# Patient Record
Sex: Male | Born: 1990 | Race: Black or African American | Hispanic: No | Marital: Single | Smoking: Current some day smoker
Health system: Southern US, Community
[De-identification: ages and names within clinical notes are randomized; demographics above are authoritative.]

## PROBLEM LIST (undated history)

## (undated) DIAGNOSIS — J45909 Unspecified asthma, uncomplicated: Secondary | ICD-10-CM

---
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
## 2010-02-03 ENCOUNTER — Emergency Department (HOSPITAL_COMMUNITY): Admission: EM | Admit: 2010-02-03 | Discharge: 2010-02-03 | Payer: Self-pay | Admitting: Emergency Medicine
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
## 2010-07-02 LAB — GC/CHLAMYDIA PROBE AMP, GENITAL
Chlamydia, DNA Probe: POSITIVE — AB
GC Probe Amp, Genital: POSITIVE — AB

## 2011-01-14 ENCOUNTER — Inpatient Hospital Stay (INDEPENDENT_AMBULATORY_CARE_PROVIDER_SITE_OTHER)
Admission: RE | Admit: 2011-01-14 | Discharge: 2011-01-14 | Disposition: A | Discharge status: Home / Self Care | Payer: Self-pay | Source: Ambulatory Visit | Attending: Family Medicine | Admitting: Family Medicine

## 2011-01-14 DIAGNOSIS — N342 Other urethritis: Secondary | ICD-10-CM

## 2011-01-15 LAB — GC/CHLAMYDIA PROBE AMP, GENITAL
Chlamydia, DNA Probe: NEGATIVE
GC Probe Amp, Genital: NEGATIVE

## 2011-05-19 ENCOUNTER — Emergency Department (HOSPITAL_COMMUNITY)
Admission: EM | Admit: 2011-05-19 | Discharge: 2011-05-19 | Disposition: A | Discharge status: Home / Self Care | Payer: Self-pay | Attending: Emergency Medicine | Admitting: Emergency Medicine

## 2011-05-19 ENCOUNTER — Encounter (HOSPITAL_COMMUNITY): Payer: Self-pay | Admitting: *Deleted

## 2011-05-19 DIAGNOSIS — L2989 Other pruritus: Secondary | ICD-10-CM | POA: Insufficient documentation

## 2011-05-19 DIAGNOSIS — L298 Other pruritus: Secondary | ICD-10-CM | POA: Insufficient documentation

## 2011-05-19 DIAGNOSIS — R21 Rash and other nonspecific skin eruption: Secondary | ICD-10-CM | POA: Insufficient documentation

## 2011-05-19 DIAGNOSIS — B86 Scabies: Secondary | ICD-10-CM | POA: Insufficient documentation

## 2011-05-19 MED ORDER — PERMETHRIN 5 % EX CREA: TOPICAL_CREAM | CUTANEOUS | Status: AC

## 2011-05-19 NOTE — ED Notes (Signed)
The pt says he has scabies.  His girlfriend was just diagnosed .  He has had the rash for 30 days

## 2011-05-19 NOTE — ED Provider Notes (Signed)
History     CSN: [id number]  Arrival date & time 05/19/11  1950   None     Chief Complaint  Patient presents with  . Rash    HPI: Patient is a 21 y.o. male presenting with rash. The history is provided by the patient.  Rash  This is a new problem. The current episode started more than 1 week ago. The problem has been gradually worsening. There has been no fever. The rash is present on the torso, right arm, left arm, right upper leg and left upper leg.  Patient reports recent exposure to friend w/ scabies. States has had "itchy" rash x 2  weeks.   History reviewed. No pertinent past medical history.  History reviewed. No pertinent past surgical history.  History reviewed. No pertinent family history.  History  Substance Use Topics  . Smoking status: Not on file  . Smokeless tobacco: Not on file  . Alcohol Use: Not on file      Review of Systems  Constitutional: Negative.   HENT: Negative.   Eyes: Negative.   Respiratory: Negative.   Cardiovascular: Negative.   Gastrointestinal: Negative.   Genitourinary: Negative.   Musculoskeletal: Negative.   Skin: Positive for rash.  Neurological: Negative.   Hematological: Negative.   Psychiatric/Behavioral: Negative.     Allergies  Review of patient's allergies indicates no known allergies.  Home Medications  No current outpatient prescriptions on file.  There were no vitals taken for this visit.  Physical Exam  Constitutional: He appears well-developed and well-nourished.  HENT:  Head: Normocephalic and atraumatic.  Eyes: Conjunctivae are normal.  Cardiovascular: Normal rate.   Pulmonary/Chest: Effort normal.  Musculoskeletal: Normal range of motion.  Neurological: He is alert.  Skin: Skin is warm and dry.       Mildly erythematous, scattered papules to BUE's and bil upper legs c/w scabies. No burrows noted to hands or wrist.  Psychiatric: He has a normal mood and affect.    ED Course  Procedures   Labs  Reviewed - No data to display No results found.   No diagnosis found.    MDM  HPI/PE and clinical findings c/w scabies        Leanne Chang, NP 05/19/11 2117

## 2011-05-20 NOTE — ED Provider Notes (Signed)
Medical screening examination/treatment/procedure(s) were performed by non-physician practitioner and as supervising physician I was immediately available for consultation/collaboration.   Benny Lennert, MD 05/20/11 2306

## 2012-10-10 ENCOUNTER — Emergency Department (HOSPITAL_COMMUNITY)
Admission: EM | Admit: 2012-10-10 | Discharge: 2012-10-10 | Disposition: A | Discharge status: Home / Self Care | Payer: Self-pay | Attending: Emergency Medicine | Admitting: Emergency Medicine

## 2012-10-10 ENCOUNTER — Encounter (HOSPITAL_COMMUNITY): Payer: Self-pay | Admitting: Nurse Practitioner

## 2012-10-10 DIAGNOSIS — F17200 Nicotine dependence, unspecified, uncomplicated: Secondary | ICD-10-CM | POA: Insufficient documentation

## 2012-10-10 DIAGNOSIS — B009 Herpesviral infection, unspecified: Secondary | ICD-10-CM | POA: Insufficient documentation

## 2012-10-10 DIAGNOSIS — K1370 Unspecified lesions of oral mucosa: Secondary | ICD-10-CM | POA: Insufficient documentation

## 2012-10-10 LAB — RAPID STREP SCREEN (MED CTR MEBANE ONLY): Streptococcus, Group A Screen (Direct): NEGATIVE

## 2012-10-10 MED ORDER — HYDROCODONE-ACETAMINOPHEN 5-325 MG PO TABS
2.0000 | ORAL_TABLET | Freq: Once | ORAL | Status: AC
  Administered 2012-10-10: 2 via ORAL
  Filled 2012-10-10: qty 2

## 2012-10-10 MED ORDER — ACYCLOVIR 200 MG PO CAPS
200.0000 mg | ORAL_CAPSULE | Freq: Once | ORAL | Status: AC
  Administered 2012-10-10: 200 mg via ORAL
  Filled 2012-10-10: qty 1

## 2012-10-10 MED ORDER — MAGIC MOUTHWASH W/LIDOCAINE: 5.0000 mL | Freq: Four times a day (QID) | ORAL | Status: DC | PRN

## 2012-10-10 MED ORDER — ACYCLOVIR 400 MG PO TABS: 800.0000 mg | ORAL_TABLET | Freq: Every day | ORAL | Status: DC

## 2012-10-10 NOTE — ED Notes (Signed)
States he woke this am and had sores around mouth, sore throat, painful to swallow. Breathing easily

## 2012-10-10 NOTE — ED Notes (Signed)
Pt states he started 2 days ago with sorethroat, sores on mouth and lip swelling. Tonsils enlarged.

## 2012-10-10 NOTE — ED Notes (Signed)
Waiting on medication from the pharmacy before discharging pt. 

## 2012-10-10 NOTE — ED Provider Notes (Signed)
   History    CSN: [id number] Arrival date & time 10/10/12  1302  First MD Initiated Contact with Patient 10/10/12 1510     Chief Complaint  Patient presents with  . Sore Throat   (Consider location/radiation/quality/duration/timing/severity/associated sxs/prior Treatment) HPI Comments: Patient is a 22 year old male with no past medical history who presents with a 2 day history of sores in his mouth. Patient reports waking up with the sores 2 days ago. He reports multiple areas of lesions in his mouth that are painful. The pain is burning and severe. Patient has not tried anything for symptoms. No alleviating factors. Touching the affected areas makes the pain worse. No other symptoms.   Patient is a 22 y.o. male presenting with pharyngitis.  Sore Throat   History reviewed. No pertinent past medical history. History reviewed. No pertinent past surgical history. History reviewed. No pertinent family history. History  Substance Use Topics  . Smoking status: Current Some Day Smoker    Types: Cigarettes  . Smokeless tobacco: Not on file  . Alcohol Use: Yes    Review of Systems  Skin: Positive for wound.  All other systems reviewed and are negative.    Allergies  Review of patient's allergies indicates no known allergies.  Home Medications  No current outpatient prescriptions on file. BP 134/93  Pulse 99  Temp(Src) 98.8 F (37.1 C) (Oral)  Resp 20  SpO2 94% Physical Exam  Nursing note and vitals reviewed. Constitutional: He is oriented to person, place, and time. He appears well-developed and well-nourished. No distress.  HENT:  Head: Normocephalic and atraumatic.  Mouth/Throat: No oropharyngeal exudate.  Multiple areas of small, clustered ulcerative lesions on erythematous base in mouth. Tender to palpate.   Eyes: Conjunctivae and EOM are normal.  Neck: Normal range of motion.  Cardiovascular: Normal rate and regular rhythm.  Exam reveals no gallop and no friction  rub.   No murmur heard. Pulmonary/Chest: Effort normal and breath sounds normal. He has no wheezes. He has no rales. He exhibits no tenderness.  Abdominal: Soft. There is no tenderness.  Musculoskeletal: Normal range of motion.  Neurological: He is alert and oriented to person, place, and time. Coordination normal.  Speech is goal-oriented. Moves limbs without ataxia.   Skin: Skin is warm and dry.  Psychiatric: He has a normal mood and affect. His behavior is normal.    ED Course  Procedures (including critical care time) Labs Reviewed  RAPID STREP SCREEN  CULTURE, GROUP A STREP   No results found. 1. Herpes simplex     MDM  4:03 PM Patient appears to have herpes simplex. Patient will have acyclovir and Vicodin for pain. Patient will be discharged with Acyclovir and magic mouth wash prescription. Patient will have resource guide for follow up.   Emilia Beck, PA-C 10/10/12 1612

## 2012-10-10 NOTE — ED Provider Notes (Addendum)
Medical screening examination/treatment/procedure(s) were conducted as a shared visit with non-physician practitioner(s) and myself.  I personally evaluated the patient during the encounter  Multiple ulcerations along both lips, inner oropharynx, and tip of tongue, painful, swollen.  Occurred aver past 2 days.  No prior h/o ulcers.  Pt has had some myalgias, no overt fevers, has sore throat, mild pain to swallow.  Pt denies IV drug use, recent new sexual partners.  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  Would benefit from symptoms control and acyclovir or valtrex.  Pt is not toxic, no fever, handling secretions, no SOB, normal lung sounds.  Gavin Pound. Oletta Lamas, MD 10/10/12 1559  Gavin Pound. Tonetta Napoles, MD 10/10/12 1610

## 2012-10-12 LAB — CULTURE, GROUP A STREP

## 2012-11-20 ENCOUNTER — Encounter (HOSPITAL_COMMUNITY): Payer: Self-pay | Admitting: *Deleted

## 2012-11-20 ENCOUNTER — Emergency Department (HOSPITAL_COMMUNITY)
Admission: EM | Admit: 2012-11-20 | Discharge: 2012-11-20 | Disposition: A | Discharge status: Home / Self Care | Payer: Self-pay | Attending: Emergency Medicine | Admitting: Emergency Medicine

## 2012-11-20 DIAGNOSIS — Z79899 Other long term (current) drug therapy: Secondary | ICD-10-CM | POA: Insufficient documentation

## 2012-11-20 DIAGNOSIS — K130 Diseases of lips: Secondary | ICD-10-CM | POA: Insufficient documentation

## 2012-11-20 DIAGNOSIS — F17200 Nicotine dependence, unspecified, uncomplicated: Secondary | ICD-10-CM | POA: Insufficient documentation

## 2012-11-20 MED ORDER — HYDROCODONE-ACETAMINOPHEN 5-325 MG PO TABS: 1.0000 | ORAL_TABLET | ORAL | Status: DC | PRN

## 2012-11-20 MED ORDER — ONDANSETRON 4 MG PO TBDP
8.0000 mg | ORAL_TABLET | Freq: Once | ORAL | Status: AC
Start: 2012-11-20 — End: 2012-11-20
  Administered 2012-11-20: 8 mg via ORAL
  Filled 2012-11-20: qty 2

## 2012-11-20 MED ORDER — HYDROMORPHONE HCL PF 1 MG/ML IJ SOLN
1.0000 mg | Freq: Once | INTRAMUSCULAR | Status: AC
  Administered 2012-11-20: 1 mg via INTRAMUSCULAR
  Filled 2012-11-20: qty 1

## 2012-11-20 MED ORDER — HYDROCODONE-ACETAMINOPHEN 5-325 MG PO TABS
1.0000 | ORAL_TABLET | Freq: Once | ORAL | Status: AC
  Administered 2012-11-20: 1 via ORAL
  Filled 2012-11-20: qty 1

## 2012-11-20 MED ORDER — DOXYCYCLINE HYCLATE 100 MG PO CAPS: 100.0000 mg | ORAL_CAPSULE | Freq: Two times a day (BID) | ORAL | Status: DC

## 2012-11-20 NOTE — ED Notes (Signed)
Pt reports having swelling to upper lip since tues, severe swelling noted and spreading into his left cheek. Airway intact.

## 2012-11-20 NOTE — ED Provider Notes (Signed)
CSN: [id number]     Arrival date & time 11/20/12  1457 History     First MD Initiated Contact with Patient 11/20/12 1502     Chief Complaint  Patient presents with  . Oral Swelling   (Consider location/radiation/quality/duration/timing/severity/associated sxs/prior Treatment) HPI Comments: Patient reports he developed a small area of swelling over his left upper lip 6 days ago, the next day the area increased in size.  It has since steadily increased in size, has become painful, and is draining pus.  Denies fevers, chills, body aches, N/V.  Denies other lesions.  No known medication problems.  Recently d/w herpes simplex with multiple lesions of the mouth/lips 10/10/12.   The history is provided by the patient.    No past medical history on file. No past surgical history on file. No family history on file. History  Substance Use Topics  . Smoking status: Current Some Day Smoker    Types: Cigarettes  . Smokeless tobacco: Not on file  . Alcohol Use: Yes    Review of Systems  Constitutional: Negative for fever and chills.  HENT: Positive for mouth sores.   Gastrointestinal: Negative for nausea and vomiting.  Musculoskeletal: Negative for myalgias.  Allergic/Immunologic: Negative for immunocompromised state.    Allergies  Review of patient's allergies indicates no known allergies.  Home Medications   Current Outpatient Rx  Name  Route  Sig  Dispense  Refill  . albuterol (PROVENTIL HFA;VENTOLIN HFA) 108 (90 BASE) MCG/ACT inhaler   Inhalation   Inhale 2 puffs into the lungs every 6 (six) hours as needed for wheezing.         . Multiple Vitamin (MULTIVITAMIN WITH MINERALS) TABS   Oral   Take 1 tablet by mouth daily.          BP 145/87  Pulse 97  Resp 18  Ht 5\' 6"  (1.676 m)  Wt 155 lb (70.308 kg)  BMI 25.03 kg/m2  SpO2 100% Physical Exam  Nursing note and vitals reviewed. Constitutional: He appears well-developed and well-nourished. No distress.  HENT:  Head:  Normocephalic and atraumatic.    Mouth/Throat: Oropharynx is clear and moist. No oropharyngeal exudate.  Neck: Normal range of motion. Neck supple.  Pulmonary/Chest: Effort normal.  Neurological: He is alert.  Skin: He is not diaphoretic.    ED Course   Procedures (including critical care time)  Labs Reviewed - No data to display No results found.  INCISION AND DRAINAGE Performed by: Trixie Dredge B Consent: Verbal consent obtained. Risks and benefits: risks, benefits and alternatives were discussed Type: abscess  Body area: left upper lip  Anesthesia: infraorbital block Incision was made with a scalpel.  Local anesthetic: lidocaine 2% no epinephrine  Anesthetic total: 5 ml  Complexity: complex Blunt dissection to break up loculations  Drainage: purulent  Drainage amount: large  Patient tolerance: Patient tolerated the procedure well with no immediate complications.     1. Lip abscess     MDM  Pt with recent outbreak of herpes simplex over his mouth p/w apparent abscess of left upper lip.  He is well appearing.  No known medical problems.  Not known to be immunocompromised.  I discussed this with him and encouraged him to get testing for HIV and diabetes - pt declined for this visit but states he will follow up.  I&D performed in ED with large amount of purulent discharge.  Pt d/c home on doxycycline, pain medication.  Discussed all results,  findings, treatment, follow up  with patient.  Pt given verbal and written return precautions.  Pt verbalizes understanding and agrees with plan.     Trixie Dredge, PA-C 11/20/12 1639

## 2012-11-22 NOTE — ED Provider Notes (Signed)
Medical screening examination/treatment/procedure(s) were performed by non-physician practitioner and as supervising physician I was immediately available for consultation/collaboration.   Christopher J. Pollina, MD 11/22/12 0719 

## 2013-06-18 DEATH — deceased

## 2014-01-22 ENCOUNTER — Encounter (HOSPITAL_COMMUNITY): Payer: Self-pay | Admitting: Emergency Medicine

## 2014-01-22 ENCOUNTER — Emergency Department (HOSPITAL_COMMUNITY)
Admission: EM | Admit: 2014-01-22 | Discharge: 2014-01-22 | Disposition: A | Discharge status: Home / Self Care | Payer: Self-pay | Attending: Emergency Medicine | Admitting: Emergency Medicine

## 2014-01-22 DIAGNOSIS — G44309 Post-traumatic headache, unspecified, not intractable: Secondary | ICD-10-CM | POA: Insufficient documentation

## 2014-01-22 DIAGNOSIS — Y92410 Unspecified street and highway as the place of occurrence of the external cause: Secondary | ICD-10-CM | POA: Insufficient documentation

## 2014-01-22 DIAGNOSIS — Z720 Tobacco use: Secondary | ICD-10-CM | POA: Insufficient documentation

## 2014-01-22 DIAGNOSIS — G44319 Acute post-traumatic headache, not intractable: Secondary | ICD-10-CM

## 2014-01-22 DIAGNOSIS — Y9389 Activity, other specified: Secondary | ICD-10-CM | POA: Insufficient documentation

## 2014-01-22 MED ORDER — CYCLOBENZAPRINE HCL 10 MG PO TABS: 10.0000 mg | ORAL_TABLET | Freq: Two times a day (BID) | ORAL | Status: DC | PRN

## 2014-01-22 MED ORDER — NAPROXEN 500 MG PO TABS: 500.0000 mg | ORAL_TABLET | Freq: Two times a day (BID) | ORAL | Status: DC

## 2014-01-22 MED ORDER — NAPROXEN 250 MG PO TABS
500.0000 mg | ORAL_TABLET | ORAL | Status: AC
  Administered 2014-01-22: 500 mg via ORAL
  Filled 2014-01-22: qty 2

## 2014-01-22 NOTE — Discharge Instructions (Signed)
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
## 2017-05-18 ENCOUNTER — Emergency Department (HOSPITAL_COMMUNITY)
Admission: EM | Admit: 2017-05-18 | Discharge: 2017-05-19 | Disposition: A | Discharge status: Home / Self Care | Payer: No Typology Code available for payment source | Attending: Emergency Medicine | Admitting: Emergency Medicine

## 2017-05-18 ENCOUNTER — Encounter (HOSPITAL_COMMUNITY): Payer: Self-pay | Admitting: Emergency Medicine

## 2017-05-18 ENCOUNTER — Other Ambulatory Visit: Payer: Self-pay

## 2017-05-18 DIAGNOSIS — M791 Myalgia, unspecified site: Secondary | ICD-10-CM | POA: Diagnosis present

## 2017-05-18 DIAGNOSIS — M7918 Myalgia, other site: Secondary | ICD-10-CM | POA: Diagnosis not present

## 2017-05-18 HISTORY — DX: Unspecified asthma, uncomplicated: J45.909
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
## 2017-05-19 MED ORDER — CYCLOBENZAPRINE HCL 10 MG PO TABS: 10.0000 mg | ORAL_TABLET | Freq: Two times a day (BID) | ORAL | 0 refills | Status: DC | PRN

## 2017-05-19 MED ORDER — IBUPROFEN 600 MG PO TABS: 600.0000 mg | ORAL_TABLET | Freq: Four times a day (QID) | ORAL | 0 refills | Status: DC | PRN
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
## 2019-07-28 ENCOUNTER — Other Ambulatory Visit: Payer: Self-pay

## 2019-07-28 ENCOUNTER — Encounter (HOSPITAL_COMMUNITY): Payer: Self-pay

## 2019-07-28 ENCOUNTER — Ambulatory Visit (HOSPITAL_COMMUNITY)
Admission: EM | Admit: 2019-07-28 | Discharge: 2019-07-28 | Disposition: A | Discharge status: Home / Self Care | Payer: Self-pay | Attending: Urgent Care | Admitting: Urgent Care

## 2019-07-28 DIAGNOSIS — L309 Dermatitis, unspecified: Secondary | ICD-10-CM | POA: Insufficient documentation

## 2019-07-28 DIAGNOSIS — Z7251 High risk heterosexual behavior: Secondary | ICD-10-CM | POA: Insufficient documentation

## 2019-07-28 DIAGNOSIS — R369 Urethral discharge, unspecified: Secondary | ICD-10-CM | POA: Insufficient documentation

## 2019-07-28 MED ORDER — AZITHROMYCIN 250 MG PO TABS
1000.0000 mg | ORAL_TABLET | Freq: Once | ORAL | Status: AC
  Administered 2019-07-28: 1000 mg via ORAL

## 2019-07-28 MED ORDER — CEFTRIAXONE SODIUM 500 MG IJ SOLR
INTRAMUSCULAR | Status: AC
  Filled 2019-07-28: qty 500

## 2019-07-28 MED ORDER — LIDOCAINE HCL (PF) 1 % IJ SOLN
INTRAMUSCULAR | Status: AC
  Filled 2019-07-28: qty 2

## 2019-07-28 MED ORDER — CEFTRIAXONE SODIUM 500 MG IJ SOLR
500.0000 mg | Freq: Once | INTRAMUSCULAR | Status: AC
  Administered 2019-07-28: 18:00:00 500 mg via INTRAMUSCULAR

## 2019-07-28 MED ORDER — TRIAMCINOLONE ACETONIDE 0.1 % EX CREA: 1.0000 "application " | TOPICAL_CREAM | Freq: Two times a day (BID) | CUTANEOUS | 0 refills | Status: DC

## 2019-07-28 MED ORDER — AZITHROMYCIN 250 MG PO TABS
ORAL_TABLET | ORAL | Status: AC
  Filled 2019-07-28: qty 4

## 2019-07-28 NOTE — ED Triage Notes (Signed)
Pt c/o white/yellowish penile discharge started yesterday. Pt denies any other symptoms.

## 2019-07-28 NOTE — ED Provider Notes (Signed)
[facility]   MRN: [id number] DOB: 12/11/1990  Subjective:   Cesar Bennett is a 29 y.o. male presenting for 2 day hx of acute onset penile discharge. Has 2 male partners, has unprotected sex. Denies dysuria, hematuria, urinary frequency, penile swelling, testicular pain, testicular swelling, anal pain, groin pain.   No current facility-administered medications for this encounter.  Current Outpatient Medications:  .  albuterol (PROVENTIL HFA;VENTOLIN HFA) 108 (90 BASE) MCG/ACT inhaler, Inhale 2 puffs into the lungs every 6 (six) hours as needed for wheezing., Disp: , Rfl:  .  cyclobenzaprine (FLEXERIL) 10 MG tablet, Take 1 tablet (10 mg total) by mouth 2 (two) times daily as needed for muscle spasms., Disp: 20 tablet, Rfl: 0 .  ibuprofen (ADVIL,MOTRIN) 600 MG tablet, Take 1 tablet (600 mg total) by mouth every 6 (six) hours as needed., Disp: 30 tablet, Rfl: 0 .  naproxen (NAPROSYN) 500 MG tablet, Take 1 tablet (500 mg total) by mouth 2 (two) times daily., Disp: 30 tablet, Rfl: 0   No Known Allergies  Past Medical History:  Diagnosis Date  . Asthma      History reviewed. No pertinent surgical history.  Family History  Problem Relation Age of Onset  . Hypertension Other     Social History   Tobacco Use  . Smoking status: Current Some Day Smoker    Types: Cigarettes  . Smokeless tobacco: Never Used  Substance Use Topics  . Alcohol use: Yes    Alcohol/week: 9.0 standard drinks    Types: 9 Cans of beer per week    Comment: 1/5 liquor a wk  . Drug use: No    ROS   Objective:   Vitals: BP (!) 144/103   Pulse 75   Temp 98.3 F (36.8 C) (Oral)   Resp 16   Ht 5\' 6"  (1.676 m)   Wt 170 lb (77.1 kg)   SpO2 99%   BMI 27.44 kg/m   Physical Exam Constitutional:      General: He is not in acute distress.    Appearance: Normal appearance. He is well-developed and normal weight. He is not ill-appearing, toxic-appearing or diaphoretic.  HENT:     Head:  Normocephalic and atraumatic.     Right Ear: External ear normal.     Left Ear: External ear normal.     Nose: Nose normal.     Mouth/Throat:     Pharynx: Oropharynx is clear.  Eyes:     General: No scleral icterus.       Right eye: No discharge.        Left eye: No discharge.     Extraocular Movements: Extraocular movements intact.     Pupils: Pupils are equal, round, and reactive to light.  Cardiovascular:     Rate and Rhythm: Normal rate.  Pulmonary:     Effort: Pulmonary effort is normal.  Genitourinary:    Penis: Circumcised. Discharge present. No phimosis, paraphimosis, hypospadias, erythema, tenderness, swelling or lesions.   Musculoskeletal:     Cervical back: Normal range of motion.  Skin:    Comments: Multiple dry scaly patches along the sides of his torso and neck.  Areas are pruritic.  Neurological:     Mental Status: He is alert and oriented to person, place, and time.  Psychiatric:        Mood and Affect: Mood normal.        Behavior: Behavior normal.        Thought Content: Thought content  normal.        Judgment: Judgment normal.      Assessment and Plan :   1. Penile discharge   2. Unprotected sex   3. Dermatitis     Patient given IM ceftriaxone in clinic, azithromycin p.o. as well. Labs pending.   Counseled on safe sex practices including abstaining for 1 week following treatment. Counseled patient on potential for adverse effects with medications prescribed/recommended today, ER and return-to-clinic precautions discussed, patient verbalized understanding.  At the end of the office visit, patient requested medication for rash.  Use triamcinolone for inflammatory dermatitis.  Counseled that if his rash significantly worsens, needs a recheck and consideration to get treated for fungal infection. Counseled patient on potential for adverse effects with medications prescribed/recommended today, ER and return-to-clinic precautions discussed, patient verbalized  understanding.    Jaynee Eagles, [place] 07/28/19 1826

## 2019-07-28 NOTE — Discharge Instructions (Addendum)
Avoid all forms of sexual intercourse (oral, vaginal, anal) for the next 7 days to avoid spreading/reinfecting. Return if symptoms worsen/do not resolve, you develop fever, abdominal pain, blood in your urine, or are re-exposed to an STI.  Continue to hydrate very well.  It is okay to use the triamcinolone cream over the areas of dermatitis that she have.  Apply thin layer twice daily for 1 week and then give your skin a 1 week rest before resuming the cream again as needed.

## 2019-07-28 NOTE — ED Triage Notes (Deleted)
Pt presents to UC for STD's testing. Pt states having clear penile discharge x 1 week.

## 2019-07-31 LAB — CYTOLOGY, (ORAL, ANAL, URETHRAL) ANCILLARY ONLY
Chlamydia: NEGATIVE
Comment: NEGATIVE
Comment: NEGATIVE
Comment: NORMAL
Neisseria Gonorrhea: POSITIVE — AB
Trichomonas: NEGATIVE

## 2019-10-24 ENCOUNTER — Other Ambulatory Visit: Payer: Self-pay

## 2019-10-24 ENCOUNTER — Ambulatory Visit (HOSPITAL_COMMUNITY)
Admission: EM | Admit: 2019-10-24 | Discharge: 2019-10-24 | Disposition: A | Discharge status: Home / Self Care | Payer: Self-pay | Attending: Family Medicine | Admitting: Family Medicine

## 2019-10-24 ENCOUNTER — Encounter (HOSPITAL_COMMUNITY): Payer: Self-pay

## 2019-10-24 DIAGNOSIS — M545 Low back pain, unspecified: Secondary | ICD-10-CM

## 2019-10-24 MED ORDER — IBUPROFEN 800 MG PO TABS: 800.0000 mg | ORAL_TABLET | Freq: Three times a day (TID) | ORAL | 0 refills | Status: DC

## 2019-10-24 MED ORDER — CYCLOBENZAPRINE HCL 5 MG PO TABS: 5.0000 mg | ORAL_TABLET | Freq: Two times a day (BID) | ORAL | 0 refills | Status: DC | PRN

## 2019-10-24 NOTE — Discharge Instructions (Signed)
Use anti-inflammatories for pain/swelling. You may take up to 800 mg Ibuprofen every 8 hours with food. You may supplement Ibuprofen with Tylenol [phone] mg every 8 hours.   You may use flexeril as needed to help with pain. This is a muscle relaxer and causes sedation- please use only at bedtime or when you will be home and not have to drive/work  Gentle stretching, avoid bed rest, avoid heavy lifting  Follow up if not improving or worsening
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
## 2023-07-30 ENCOUNTER — Ambulatory Visit (HOSPITAL_COMMUNITY)
Admission: EM | Admit: 2023-07-30 | Discharge: 2023-07-30 | Disposition: A | Discharge status: Home / Self Care | Payer: Self-pay | Attending: Nurse Practitioner | Admitting: Nurse Practitioner

## 2023-07-30 ENCOUNTER — Encounter (HOSPITAL_COMMUNITY): Payer: Self-pay

## 2023-07-30 DIAGNOSIS — R0602 Shortness of breath: Secondary | ICD-10-CM

## 2023-07-30 DIAGNOSIS — J4521 Mild intermittent asthma with (acute) exacerbation: Secondary | ICD-10-CM

## 2023-07-30 DIAGNOSIS — J45909 Unspecified asthma, uncomplicated: Secondary | ICD-10-CM

## 2023-07-30 DIAGNOSIS — R062 Wheezing: Secondary | ICD-10-CM

## 2023-07-30 DIAGNOSIS — R051 Acute cough: Secondary | ICD-10-CM

## 2023-07-30 MED ORDER — DEXAMETHASONE SODIUM PHOSPHATE 10 MG/ML IJ SOLN
10.0000 mg | Freq: Once | INTRAMUSCULAR | Status: AC
  Administered 2023-07-30: 10 mg via INTRAMUSCULAR

## 2023-07-30 MED ORDER — DEXAMETHASONE SODIUM PHOSPHATE 10 MG/ML IJ SOLN
INTRAMUSCULAR | Status: AC
  Filled 2023-07-30: qty 1

## 2023-07-30 MED ORDER — PREDNISONE 10 MG (21) PO TBPK: ORAL_TABLET | Freq: Every day | ORAL | 0 refills | Status: AC

## 2023-07-30 MED ORDER — CHLORPHEN-PE-ACETAMINOPHEN 4-10-325 MG PO TABS: 1.0000 | ORAL_TABLET | Freq: Three times a day (TID) | ORAL | 0 refills | Status: AC

## 2023-07-30 MED ORDER — ALBUTEROL SULFATE (2.5 MG/3ML) 0.083% IN NEBU
2.5000 mg | INHALATION_SOLUTION | Freq: Once | RESPIRATORY_TRACT | Status: AC
  Administered 2023-07-30: 2.5 mg via RESPIRATORY_TRACT

## 2023-07-30 MED ORDER — PSEUDOEPH-BROMPHEN-DM 30-2-10 MG/5ML PO SYRP: 10.0000 mL | ORAL_SOLUTION | Freq: Four times a day (QID) | ORAL | 0 refills | Status: AC | PRN

## 2023-07-30 MED ORDER — ALBUTEROL SULFATE (2.5 MG/3ML) 0.083% IN NEBU
INHALATION_SOLUTION | RESPIRATORY_TRACT | Status: AC
  Filled 2023-07-30: qty 3

## 2023-07-30 NOTE — Discharge Instructions (Addendum)
 You are having an asthma flare-up likely triggered by seasonal allergies. You've been prescribed a steroid to reduce inflammation, an allergy medication, and a cough medication to help manage your symptoms. Please stop taking Zyrtec while using the prescribed allergy medication. Once you finish the prescription, you may go back to taking Zyrtec daily as needed. Try to stay indoors when pollen count is high and air quality is poor. Continue using your inhaler as needed for shortness of breath or wheezing. If you have trouble breathing, chest pain, or symptoms that get worse instead of better, go to the emergency room right away.

## 2023-07-30 NOTE — ED Triage Notes (Signed)
 Patient presenting with SOB and a history of asthma onset 3 days ago. Patient denies sick symptoms or known exposure.  Prescriptions or OTC medications tried: Yes- Albuterol inhaler    with little relief

## 2023-07-30 NOTE — ED Provider Notes (Addendum)
 [facility]    CSN: [id number] Arrival date & time: 07/30/23  0912      History   Chief Complaint Chief Complaint  Patient presents with   Shortness of Breath    HPI Cesar Bennett is a 33 y.o. male.   Subjective:   Cesar Bennett is a 33 y.o. male who presents for chest tightness, dyspnea, non-productive cough, and wheezing.  Onset of symptoms about 3 to 4 days ago.  He also has had a runny nose.  No fevers, sore throat, eye itching, nausea, vomiting, diarrhea or headache.  Patient does have a history of asthma and attributes the pollen to his symptoms. He has had increased and frequent use of  his MDI since symptoms started. He also took mullein leaf drops.  Patient reports that he tries to take an over-the-counter Zyrtec daily once the spring season comes due to his known pollen intolerance.  He smokes marijuana regularly but no cigarettes and he does not vape.  He does not have a nebulizer machine at home.  The following portions of the patient's history were reviewed and updated as appropriate: allergies, current medications, past family history, past medical history, past social history, past surgical history, and problem list        Past Medical History:  Diagnosis Date   Asthma     There are no active problems to display for this patient.   History reviewed. No pertinent surgical history.     Home Medications    Prior to Admission medications   Medication Sig Start Date End Date Taking? Authorizing Provider  albuterol (PROVENTIL HFA;VENTOLIN HFA) 108 (90 BASE) MCG/ACT inhaler Inhale 2 puffs into the lungs every 6 (six) hours as needed for wheezing.   Yes [provider]  brompheniramine-pseudoephedrine-DM 30-2-10 MG/5ML syrup Take 10 mLs by mouth every 6 (six) hours as needed (cough and congestion). 07/30/23  Yes Lurline Idol, FNP  Chlorphen-PE-Acetaminophen 4-10-325 MG TABS Take 1 tablet by mouth in the morning, at noon, and at  bedtime for 7 days. 07/30/23 08/06/23 Yes Shaylan Tutton, Lelon Mast, FNP  predniSONE (STERAPRED UNI-PAK 21 TAB) 10 MG (21) TBPK tablet Take by mouth daily. Take 6 tabs by mouth daily  for 2 days, then 5 tabs for 2 days, then 4 tabs for 2 days, then 3 tabs for 2 days, 2 tabs for 2 days, then 1 tab by mouth daily for 2 days 07/30/23  Yes Lurline Idol, FNP    Family History Family History  Problem Relation Age of Onset   Hypertension Other     Social History Social History   Tobacco Use   Smoking status: Former    Types: Cigarettes   Smokeless tobacco: Never  Vaping Use   Vaping status: Never Used  Substance Use Topics   Alcohol use: Yes    Alcohol/week: 9.0 standard drinks of alcohol    Types: 9 Cans of beer per week    Comment: 1/5 liquor a wk   Drug use: Yes    Types: Marijuana     Allergies   Patient has no known allergies.   Review of Systems Review of Systems  Constitutional:  Negative for fever.  HENT:  Positive for rhinorrhea and sneezing. Negative for congestion, postnasal drip and sore throat.   Eyes:  Negative for discharge and itching.  Respiratory:  Positive for cough, shortness of breath and wheezing.   Gastrointestinal:  Negative for diarrhea, nausea and vomiting.  Neurological:  Negative for headaches.  All other systems reviewed and are negative.    Physical Exam Triage Vital Signs ED Triage Vitals  Encounter Vitals Group     BP 07/30/23 0946 131/81     Systolic BP Percentile --      Diastolic BP Percentile --      Pulse Rate 07/30/23 0946 83     Resp 07/30/23 0946 18     Temp 07/30/23 0946 98.2 F (36.8 C)     Temp Source 07/30/23 0946 Oral     SpO2 07/30/23 0946 94 %     Weight --      Height --      Head Circumference --      Peak Flow --      Pain Score 07/30/23 0945 0     Pain Loc --      Pain Education --      Exclude from Growth Chart --    No data found.  Updated Vital Signs BP 131/81 (BP Location: Right Arm)   Pulse 83   Temp  98.2 F (36.8 C) (Oral)   Resp 18   SpO2 94%   Visual Acuity Right Eye Distance:   Left Eye Distance:   Bilateral Distance:    Right Eye Near:   Left Eye Near:    Bilateral Near:     Physical Exam Vitals reviewed.  Constitutional:      General: He is not in acute distress.    Appearance: He is well-developed and normal weight. He is not ill-appearing or toxic-appearing.  HENT:     Head: Normocephalic.     Nose: Nose normal.     Mouth/Throat:     Mouth: Mucous membranes are moist.  Eyes:     Pupils: Pupils are equal, round, and reactive to light.  Cardiovascular:     Rate and Rhythm: Normal rate and regular rhythm.     Heart sounds: Normal heart sounds.  Pulmonary:     Effort: Pulmonary effort is normal. No tachypnea or respiratory distress.     Breath sounds: Wheezing present. No decreased breath sounds.     Comments: Scant expiratory wheezing noted mostly throughout the bases. Respirations even and unlabored. No acute distress noted.  Musculoskeletal:        General: Normal range of motion.     Cervical back: Normal range of motion and neck supple.  Lymphadenopathy:     Cervical: No cervical adenopathy.  Skin:    General: Skin is warm and dry.  Neurological:     General: No focal deficit present.     Mental Status: He is alert and oriented to person, place, and time.      UC Treatments / Results  Labs (all labs ordered are listed, but only abnormal results are displayed) Labs Reviewed - No data to display  EKG   Radiology No results found.  Procedures Procedures (including critical care time)  Medications Ordered in UC Medications  albuterol (PROVENTIL) (2.5 MG/3ML) 0.083% nebulizer solution 2.5 mg (2.5 mg Nebulization Given 07/30/23 1127)  dexamethasone (DECADRON) injection 10 mg (10 mg Intramuscular Given 07/30/23 1127)    Initial Impression / Assessment and Plan / UC Course  I have reviewed the triage vital signs and the nursing  notes.  Pertinent labs & imaging results that were available during my care of the patient were reviewed by me and considered in my medical decision making (see chart for details).    33 yo male with history of asthma presents  with cough, wheezing and shortness of breath. Patient is afebrile and nontoxic. Physical exam is normal aside from mild expiratory wheezing. Respirations are even and unlabored, and the patient is in no acute distress. Albuterol nebulizer treatment was administered in the clinic along with a Decadron injection. Wheezing resolved after nebulizer. Patient reports feeling a lot better and O2 up to 98% from 94%. Prescriptions for prednisone, Norel AD, and Bromfed DM were provided. Patient reports he has sufficient supply of his metered-dose inhaler and does not require a refill. He was advised to continue using his MDI as needed. Instructed to hold Zyrtec while taking Norel AD and to resume Zyrtec after completing the Norel AD course. Supportive care measures recommended. Emergency precautions reviewed. Advised to follow up with primary care provider.  Today's evaluation has revealed no signs of a dangerous process. Discussed diagnosis with patient and/or guardian. Patient and/or guardian aware of their diagnosis, possible red flag symptoms to watch out for and need for close follow up. Patient and/or guardian understands verbal and written discharge instructions. Patient and/or guardian comfortable with plan and disposition.  Patient and/or guardian has a clear mental status at this time, good insight into illness (after discussion and teaching) and has clear judgment to make decisions regarding their care  Documentation was completed with the aid of voice recognition software. Transcription may contain typographical errors. Final Clinical Impressions(s) / UC Diagnoses   Final diagnoses:  Acute cough  Shortness of breath  Wheeze  Mild intermittent asthma with exacerbation  Asthma  due to seasonal allergies     Discharge Instructions      You are having an asthma flare-up likely triggered by seasonal allergies. You've been prescribed a steroid to reduce inflammation, an allergy medication, and a cough medication to help manage your symptoms. Please stop taking Zyrtec while using the prescribed allergy medication. Once you finish the prescription, you may go back to taking Zyrtec daily as needed. Try to stay indoors when pollen count is high and air quality is poor. Continue using your inhaler as needed for shortness of breath or wheezing. If you have trouble breathing, chest pain, or symptoms that get worse instead of better, go to the emergency room right away.     ED Prescriptions     Medication Sig Dispense Auth. Provider   predniSONE (STERAPRED UNI-PAK 21 TAB) 10 MG (21) TBPK tablet Take by mouth daily. Take 6 tabs by mouth daily  for 2 days, then 5 tabs for 2 days, then 4 tabs for 2 days, then 3 tabs for 2 days, 2 tabs for 2 days, then 1 tab by mouth daily for 2 days 42 tablet Lurline Idol, FNP   brompheniramine-pseudoephedrine-DM 30-2-10 MG/5ML syrup Take 10 mLs by mouth every 6 (six) hours as needed (cough and congestion). 120 mL Lurline Idol, FNP   Chlorphen-PE-Acetaminophen 4-10-325 MG TABS Take 1 tablet by mouth in the morning, at noon, and at bedtime for 7 days. 21 tablet Lurline Idol, FNP      PDMP not reviewed this encounter.   Lurline Idol, FNP 07/30/23 1207    Lurline Idol, [place] 07/30/23 [phone]
